# Patient Record
Sex: Male | Born: 2000 | Race: White | Hispanic: No | Marital: Single | Smoking: Never smoker
Health system: Southern US, Community
[De-identification: ages and names within clinical notes are randomized; demographics above are authoritative.]

## PROBLEM LIST (undated history)

## (undated) HISTORY — PX: ADENOIDECTOMY: SHX5191

---

## 2020-03-05 ENCOUNTER — Ambulatory Visit: Payer: Self-pay

## 2020-11-13 ENCOUNTER — Encounter (HOSPITAL_COMMUNITY): Payer: Self-pay | Admitting: Emergency Medicine

## 2020-11-13 ENCOUNTER — Emergency Department (HOSPITAL_COMMUNITY)
Admission: EM | Admit: 2020-11-13 | Discharge: 2020-11-14 | Disposition: A | Discharge status: Home / Self Care | Payer: Worker's Compensation | Attending: Emergency Medicine | Admitting: Emergency Medicine

## 2020-11-13 ENCOUNTER — Other Ambulatory Visit: Payer: Self-pay

## 2020-11-13 ENCOUNTER — Emergency Department (HOSPITAL_COMMUNITY): Payer: Worker's Compensation

## 2020-11-13 DIAGNOSIS — S02600A Fracture of unspecified part of body of mandible, initial encounter for closed fracture: Secondary | ICD-10-CM | POA: Insufficient documentation

## 2020-11-13 DIAGNOSIS — W208XXA Other cause of strike by thrown, projected or falling object, initial encounter: Secondary | ICD-10-CM | POA: Insufficient documentation

## 2020-11-13 DIAGNOSIS — S01511A Laceration without foreign body of lip, initial encounter: Secondary | ICD-10-CM | POA: Insufficient documentation

## 2020-11-13 DIAGNOSIS — S02609A Fracture of mandible, unspecified, initial encounter for closed fracture: Secondary | ICD-10-CM

## 2020-11-13 DIAGNOSIS — S0993XA Unspecified injury of face, initial encounter: Secondary | ICD-10-CM | POA: Diagnosis present

## 2020-11-13 DIAGNOSIS — Y990 Civilian activity done for income or pay: Secondary | ICD-10-CM

## 2020-11-13 MED ORDER — LIDOCAINE HCL (PF) 1 % IJ SOLN
30.0000 mL | Freq: Once | INTRAMUSCULAR | Status: AC
  Administered 2020-11-14: 30 mL
  Filled 2020-11-13: qty 30

## 2020-11-13 NOTE — ED Provider Notes (Addendum)
[facility] EMERGENCY DEPARTMENT Provider Note   CSN: [id number] Arrival date & time: 11/13/20  1929     History Chief Complaint  Patient presents with  . Lip Laceration    Jeffrey Gibbs is a 19 y.o. male presents to ER for evaluation of work related injury.  He was on the back of a work truck when a piece of plywood flew up and hit him directly on the mouth. Patient has large upper lip laceration with some abrasion of the nose.  Reports local lip burning pain, constant burning.  Denies LOC, visual changes, nausea, vomiting, neck pain or any other injuries.  No OAC. No interventions. Last tetanus in the last 2 years.   HPI     History reviewed. No pertinent past medical history.  There are no problems to display for this patient.   Past Surgical History:  Procedure Laterality Date  . ADENOIDECTOMY         No family history on file.  Social History   Tobacco Use  . Smoking status: Never Smoker  . Smokeless tobacco: Never Used  Substance Use Topics  . Alcohol use: Never  . Drug use: Never    Home Medications Prior to Admission medications   Medication Sig Start Date End Date Taking? Authorizing Provider  clindamycin (CLEOCIN) 150 MG capsule Take 3 capsules (450 mg total) by mouth 3 (three) times daily for 10 days. You may open capsules and mix into soft/pureed foods to swallow 11/14/20 11/24/20  Liberty Handy, PA-C  oxyCODONE (OXY IR/ROXICODONE) 5 MG immediate release tablet Take 1 tablet (5 mg total) by mouth every 6 (six) hours as needed for up to 3 days for severe pain. 11/14/20 11/17/20  Liberty Handy, PA-C    Allergies    Patient has no allergy information on record.  Review of Systems   Review of Systems  Skin: Positive for wound.  All other systems reviewed and are negative.   Physical Exam Updated Vital Signs BP 124/80   Pulse 84   Temp 97.6 F (36.4 C) (Axillary)   Resp 15   Ht 6' (1.829 m)   Wt 104.3 kg   SpO2  98%   BMI 31.19 kg/m   Physical Exam Vitals and nursing note reviewed.  Constitutional:      General: He is not in acute distress.    Appearance: He is well-developed.     Comments: NAD.  HENT:     Head: Normocephalic.     Comments: Small abrasion over nose bridge, non tender.  TTP upper lip and chin.  No obvious crepitus. No scalp tenderness.     Right Ear: External ear normal.     Left Ear: External ear normal.     Nose: Nose normal.     Comments: No nasal bone tenderness. Normal septum without hematoma.     Mouth/Throat:     Comments: Flap like laceration across upper vermillion border approximately 6 cm, jagged. Not through and through. Upper dentition without tenderness to percussion. Upper gingiva and mucosa unremarkable.   Curved gaping laceration at the base of lower gingiva across frenulum, no obvious bone exposed. TTP and percussion of right lateral incisor and canine, these teeth are stable without obvious fracture.  Eyes:     General: No scleral icterus.    Conjunctiva/sclera: Conjunctivae normal.     Comments: No periorbital tenderness. Normal eyelids   Cardiovascular:     Rate and Rhythm: Normal rate and regular rhythm.  Heart sounds: Normal heart sounds. No murmur heard.   Pulmonary:     Effort: Pulmonary effort is normal.     Breath sounds: Normal breath sounds. No wheezing.  Musculoskeletal:        General: No deformity. Normal range of motion.     Cervical back: Normal range of motion and neck supple.  Skin:    General: Skin is warm and dry.     Capillary Refill: Capillary refill takes less than 2 seconds.  Neurological:     Mental Status: He is alert and oriented to person, place, and time.  Psychiatric:        Behavior: Behavior normal.        Thought Content: Thought content normal.        Judgment: Judgment normal.          ED Results / Procedures / Treatments   Labs (all labs ordered are listed, but only abnormal results are  displayed) Labs Reviewed - No data to display  EKG None  Radiology CT Maxillofacial Wo Contrast  Result Date: 11/13/2020 CLINICAL DATA:  Hit in the chin EXAM: CT MAXILLOFACIAL WITHOUT CONTRAST TECHNIQUE: Multidetector CT imaging of the maxillofacial structures was performed. Multiplanar CT image reconstructions were also generated. COMPARISON:  None. FINDINGS: Osseous: There is a nondisplaced incomplete fracture seen through the right inferior mandible between the lateral incisor and canine. Small foci of subcutaneous emphysema seen overlying this region. No other fractures are identified. Orbits: No fracture identified. Unremarkable appearance of globes and orbits. Sinuses: The visualized paranasal sinuses and mastoid air cells are unremarkable. Soft tissues: Soft tissue swelling with a small hematoma and subcutaneous emphysema seen overlying the lower mandible. Limited intracranial: No acute findings. IMPRESSION: Nondisplaced incomplete fracture seen through the right inferior mandible between the lateral incisor and canine. There is small soft tissue hematoma with swelling and subcutaneous emphysema overlying the lower mandible. Electronically Signed   By: Jonna ClarkBindu  Avutu M.D.   On: 11/13/2020 21:29    Procedures .Marland Kitchen.Laceration Repair  Date/Time: 11/14/2020 1:04 AM Performed by: Liberty HandyGibbons, Leveon Pelzer J, PA-C Authorized by: Liberty HandyGibbons, Loui Massenburg J, PA-C   Consent:    Consent obtained:  Verbal   Consent given by:  Patient   Risks discussed:  Infection, pain, poor cosmetic result, poor wound healing, nerve damage and need for additional repair   Alternatives discussed:  Referral Anesthesia (see MAR for exact dosages):    Anesthesia method:  Local infiltration   Local anesthetic:  Lidocaine 1% w/o epi Laceration details:    Location:  Mouth   Mouth location: lower gingiva.   Length (cm):  6 Repair type:    Repair type:  Complex Pre-procedure details:    Preparation:  Imaging obtained to evaluate  for foreign bodies Exploration:    Limited defect created (wound extended): no     Hemostasis achieved with:  Direct pressure   Wound exploration: wound explored through full range of motion and entire depth of wound probed and visualized     Wound extent: underlying fracture and vascular damage     Contaminated: no   Treatment:    Wound cleansed with: peroxide, iodine, NS 500 cc irrigation.   Irrigation method:  Pressure wash and tap   Visualized foreign bodies/material removed: no     Debridement:  None   Undermining:  None Mucous membrane repair:    Suture size:  6-0   Suture material:  Vicryl   Suture technique:  Simple interrupted   Number of sutures:  4 Approximation:    Approximation:  Loose Post-procedure details:    Patient tolerance of procedure:  Tolerated well, no immediate complications .Marland KitchenLaceration Repair  Date/Time: 11/14/2020 1:06 AM Performed by: Liberty Handy, PA-C Authorized by: Liberty Handy, PA-C   Consent:    Consent obtained:  Verbal   Consent given by:  Patient   Risks discussed:  Infection, need for additional repair, pain, poor cosmetic result, poor wound healing, vascular damage and nerve damage   Alternatives discussed:  Referral Anesthesia (see MAR for exact dosages):    Anesthesia method:  Local infiltration   Local anesthetic:  Lidocaine 1% w/o epi Laceration details:    Location:  Lip   Lip location:  Upper exterior lip   Length (cm):  6 Repair type:    Repair type:  Complex Exploration:    Hemostasis achieved with:  Direct pressure   Wound exploration: wound explored through full range of motion and entire depth of wound probed and visualized     Wound extent: vascular damage     Wound extent: no underlying fracture noted   Treatment:    Area cleansed with:  Betadine (peroxide, iodine, NS irrigation solution 500 cc)   Irrigation method:  Pressure wash and tap   Debridement:  None   Undermining:  None   Scar revision: no    Skin repair:    Repair method:  Sutures   Suture size:  6-0   Wound skin closure material used: vicryl rapide.   Suture technique:  Simple interrupted   Number of sutures:  4 Approximation:    Approximation:  Close   Vermilion border: well-aligned (with difficulty given edema)   Post-procedure details:    Dressing:  Open (no dressing)   Patient tolerance of procedure:  Tolerated well, no immediate complications   (including critical care time)  Medications Ordered in ED Medications  lidocaine (PF) (XYLOCAINE) 1 % injection 30 mL (30 mLs Infiltration Given 11/14/20 0017)  oxyCODONE-acetaminophen (PERCOCET/ROXICET) 5-325 MG per tablet 1 tablet (1 tablet Oral Given 11/14/20 0016)  clindamycin (CLEOCIN) capsule 450 mg (450 mg Oral Given 11/14/20 0016)    ED Course  I have reviewed the triage vital signs and the nursing notes.  Pertinent labs & imaging results that were available during my care of the patient were reviewed by me and considered in my medical decision making (see chart for details).  Clinical Course as of Nov 14 113  Wynelle Link Nov 13, 2020  2138 IMPRESSION: Nondisplaced incomplete fracture seen through the right inferior mandible between the lateral incisor and canine.  There is small soft tissue hematoma with swelling and subcutaneous emphysema overlying the lower mandible.    CT Maxillofacial Wo Contrast [CG]    Clinical Course User Index [CG] Jerrell Mylar   MDM Rules/Calculators/A&P                          19 yo M here with facial trauma.  Complex laceration of upper external lip involving vermillion border and lower inner lip involving the frenulum.   Exam reassuring without physical findings to suggest other injuries.   Imaging ordered CT maxillofacial. This was personally visualized and interpreted and reveals incomplete non displaced right inferior mandible fracture.    Patient's tdap has been within last 2 years.  Discussed case with Dr  Arita Miss (plastic surgery/ENT trauma) who suggested laceration repair in ER by ED providers with outpatient follow up in clinic.  He provided specific guidance on laceration repair including vicryl rapide, loose approximation of lower inner lip laceration and minimal suturing to approximate upper external lip laceration across vermillion boarder.  Dr Arita Miss did not recommend open fracture treatment, admission or antibiotics. Discussed with patient risk of repair including need for revision, infection, vascular and nerve damage.  Risks and benefits of repair in ER discussed and he verbalized understanding and agreed to proceed. Laceration was complex as anticipated. Thoroughly irrigated. No immediate complications post procedure.   Will dc with clindamycin, soft diet, NSAIDs, oxycodone and follow up with Dr Arita Miss in 1 week. Return precautions given. Patient in agreement with ER POC.  Final Clinical Impression(s) / ED Diagnoses Final diagnoses:  Closed fracture of right side of mandible, unspecified mandibular site, initial encounter (HCC)  Lip laceration, initial encounter  Work related injury    Rx / DC Orders ED Discharge Orders         Ordered    clindamycin (CLEOCIN) 150 MG capsule  3 times daily        11/14/20 0041    oxyCODONE (OXY IR/ROXICODONE) 5 MG immediate release tablet  Every 6 hours PRN        11/14/20 0041             Liberty Handy, PA-C 11/14/20 0115    Alvira Monday, MD 11/14/20 [phone]

## 2020-11-13 NOTE — ED Triage Notes (Signed)
Pt to ED via EMS st's he was on the back of a work truck and when it turned a piece of plywood flew up and hit him in the mouth.  Pt has large lac to upper lip.  Denies LOC, teeth appear intact

## 2020-11-13 NOTE — ED Notes (Signed)
Patient transported to CT 

## 2020-11-14 ENCOUNTER — Other Ambulatory Visit: Payer: Self-pay

## 2020-11-14 MED ORDER — CLINDAMYCIN HCL 150 MG PO CAPS: 450.0000 mg | ORAL_CAPSULE | Freq: Three times a day (TID) | ORAL | 0 refills | Status: AC

## 2020-11-14 MED ORDER — CLINDAMYCIN HCL 150 MG PO CAPS
450.0000 mg | ORAL_CAPSULE | Freq: Once | ORAL | Status: AC
  Administered 2020-11-14: 450 mg via ORAL
  Filled 2020-11-14: qty 3

## 2020-11-14 MED ORDER — OXYCODONE HCL 5 MG PO TABS
5.0000 mg | ORAL_TABLET | Freq: Four times a day (QID) | ORAL | 0 refills | Status: AC | PRN
Start: 2020-11-14 — End: 2020-11-17

## 2020-11-14 MED ORDER — OXYCODONE-ACETAMINOPHEN 5-325 MG PO TABS
1.0000 | ORAL_TABLET | Freq: Once | ORAL | Status: AC
  Administered 2020-11-14: 1 via ORAL
  Filled 2020-11-14: qty 1

## 2020-11-14 NOTE — Discharge Instructions (Signed)
You were seen in the ER for facial injuries  CT shows incomplete/partial small fracture of your mandible (at level of lateral incision/canine).    You had complex lacerations of your upper/lower internal and external lips  You have absorbable sutures  For pain and inflammation you can use a combination of ibuprofen and acetaminophen.  Take [phone] mg acetaminophen (tylenol) every 6 hours or 600 mg ibuprofen (advil, motrin) every 6 hours.  You can take these separately or combine them every 6 hours for maximum pain control. Do not exceed 4,000 mg acetaminophen or 2,400 mg ibuprofen in a 24 hour period.  Do not take ibuprofen containing products if you have history of kidney disease, ulcers, GI bleeding, severe acid reflux, or take a blood thinner.  Do not take acetaminophen if you have liver disease.   For break through and/or severe pain despite ibuprofen and acetaminophen regimen, take 5 mg oxycodone every 6 hours.  Oxycodone is a narcotic pain medication that has risk of overdose, death, dependence and abuse. Mild and expected side effects include nausea, stomach upset, drowsiness, constipation. Do not consume alcohol, drive or use heavy machinery while taking this medication. Do not leave unattended around children. Flush any remaining pills that you do not use and do not share.  The emergency department has a strict policy regarding prescription of narcotic medications. We prescribe a short course for acute, new pain or injuries. We are unable to refill this medication in the emergency department for chronic pain or repeatedly.  Refill need to be done by specialist or primary care provider or pain clinic.  Contact your primary care provider or specialist for chronic pain management and refill on narcotic medications.   Soft diet only. Sleep with head slightly elevated. Gentle indirect ice (wrapped in towel) to lips can help with swelling and bruising, ice for no more than 5-10 min at a  time  Clindamycin 450 mg every 8 hours to prevent infection  Return for signs of infected wounds like more pain, swelling, warmth, pus, fevers  Call plastic surgeon Dr Arita Miss tomorrow and make an appointment for re-evaluation this week

## 2020-11-24 ENCOUNTER — Other Ambulatory Visit: Payer: Self-pay

## 2020-11-24 ENCOUNTER — Ambulatory Visit (INDEPENDENT_AMBULATORY_CARE_PROVIDER_SITE_OTHER): Payer: Worker's Compensation | Admitting: Plastic Surgery

## 2020-11-24 ENCOUNTER — Encounter: Payer: Self-pay | Admitting: Plastic Surgery

## 2020-11-24 VITALS — BP 114/71 | HR 72 | Temp 98.5°F

## 2020-11-24 DIAGNOSIS — S01511A Laceration without foreign body of lip, initial encounter: Secondary | ICD-10-CM | POA: Diagnosis not present

## 2020-11-24 NOTE — Progress Notes (Signed)
Referring Provider No referring provider defined for this encounter.   CC:  Chief Complaint  Patient presents with  . Follow-up      Jeffrey Gibbs is an 19 y.o. male.  HPI: Patient presents as a referral from the emergency room for facial trauma.  He was traveling with some boards that he had used for setting up a theater set and one of the boards shifted along return and hit him in the face.  He sustained an upper lip laceration and a gingival laceration inside the lower lip.  There was a concern for mandibular fracture on his CT scanning but this did not look to be present by my read.  He reports minimal symptoms related to his teeth and only complains of some discomfort in his lower lip at this point.  He was sutured up in the emergency room.  No Known Allergies  Outpatient Encounter Medications as of 11/24/2020  Medication Sig  . clindamycin (CLEOCIN) 150 MG capsule Take 3 capsules (450 mg total) by mouth 3 (three) times daily for 10 days. You may open capsules and mix into soft/pureed foods to swallow   No facility-administered encounter medications on file as of 11/24/2020.     No past medical history on file.  Past Surgical History:  Procedure Laterality Date  . ADENOIDECTOMY      No family history on file.  Social History   Social History Narrative  . Not on file     Review of Systems General: Denies fevers, chills, weight loss CV: Denies chest pain, shortness of breath, palpitations  Physical Exam Vitals with BMI 11/24/2020 11/14/2020 11/14/2020  Height - - -  Weight - - -  BMI - - -  Systolic 114 129 366  Diastolic 71 89 80  Pulse 72 95 84    General:  No acute distress,  Alert and oriented, Non-Toxic, Normal speech and affect On examination he has a fairly well-healed upper lip incision.  Looks to be nice alignment of the vermilion border.  The piece did appear to be partially amputated right at the central upper lip unit.  This extended onto the skin  portion of the upper lip as well.  That partially amputated piece is a bit swollen taking on a bit more prominent shape compared to the adjacent lateral lip elements.  The intraoral laceration has healed nicely.  All of his teeth appear well aligned and he reports normal occlusion.  Otherwise cranial nerves are grossly intact and sensation seems to be intact in the V1 V2 and V3 distributions.  Extraocular movements intact.  Pupils equal round reactive to light.  Assessment/Plan Patient presents after facial trauma.  He has nice early result after the emergency room sutured his upper lip.  We will have to watch down the line how this does and if he does require a revision I think it should be fairly limited.  I advised him after another week or so to start massaging this area I have given him recommendations for scar creams to to be focused on the skin portion of the upper lip.  I have advised him that silicone scar creams are the most effective and given them a couple options to pick from.  Given that he is having no symptoms and very minimal pain related to his jaw I think it safe to assume that it was a artifact seen on CT scan and there is no significant fracture that should require treatment or alter his behavior.  I am planning to see him again in 6 months to check on this and discuss its progress.  If anything happens between now and then more there are other concerns I could see him sooner.  I have also reminded him to avoid sun exposure on the upper lip scar.  Allena Napoleon 11/24/2020, 2:33 PM

## 2021-01-17 ENCOUNTER — Telehealth: Payer: Self-pay

## 2021-01-17 NOTE — Telephone Encounter (Signed)
Called and LMOM @ 4:00pm asking the patient's mother to RTC regarding the message below.//AB/CMA

## 2021-01-17 NOTE — Telephone Encounter (Signed)
Patient's mom Lavoy Bernards) called to say that since her son last saw Dr. Arita Miss, his lip has gotten bigger in one area (puffier).  Darlene said that originally the patient's whole bottom lip was more swollen and now the lip looks completely different.  She would like to know if he needs to see Dr. Arita Miss again or should she just send some pictures first.  Please call.

## 2021-01-18 ENCOUNTER — Telehealth: Payer: Self-pay | Admitting: Plastic Surgery

## 2021-01-18 ENCOUNTER — Encounter: Payer: Self-pay | Admitting: Plastic Surgery

## 2021-01-18 NOTE — Telephone Encounter (Signed)
Patient's mother called back regarding the message below.  Informed her that I spoke with [facility] and he recommend the patient use Ice,Ibuprofen, and schedule an appointment with Dr. Arita Miss.  The patient's mother verbalized understanding and agreed.  The patient's mother asked about sending picture's for Dr. Arita Miss to see.   Informed her that Dr. Arita Miss is out of the office this week, but he will be back next week.  She stated that she will go ahead and sent the picture's.   She asked how she could sent the picture's.  I informed her if the patient has MyChart she can send the picture's that way.  She verbalized understanding and agreed.  The patient's mother will call back to schedule an appointment for the patient.//AB/CMA

## 2021-01-18 NOTE — Telephone Encounter (Signed)
Patient's mom, Agustin Cree, returned call from [place]. Would like a return call.

## 2021-01-19 NOTE — Telephone Encounter (Signed)
See note on 01/17/21//AB/CMA

## 2021-02-09 ENCOUNTER — Ambulatory Visit: Payer: PRIVATE HEALTH INSURANCE | Admitting: Surgical

## 2021-02-15 ENCOUNTER — Other Ambulatory Visit: Payer: Self-pay

## 2021-02-15 ENCOUNTER — Ambulatory Visit (INDEPENDENT_AMBULATORY_CARE_PROVIDER_SITE_OTHER): Payer: Worker's Compensation | Admitting: Surgical

## 2021-02-15 ENCOUNTER — Encounter: Payer: Self-pay | Admitting: Surgical

## 2021-02-15 VITALS — BP 115/75 | HR 64

## 2021-02-15 DIAGNOSIS — S01511D Laceration without foreign body of lip, subsequent encounter: Secondary | ICD-10-CM

## 2021-02-15 NOTE — Progress Notes (Signed)
   Referring Provider No referring provider defined for this encounter.   CC:  Chief Complaint  Patient presents with  . Follow-up      Jeffrey Gibbs is an 20 y.o. male.  HPI: Patient is a 20 year old male here for follow-up on his upper lip laceration that occurred while working. Patient had this repaired in the ED on 01/14/2020 and then subsequently followed up in our office on 11/24/2020 for evaluation of the repair/further management.  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  Patient sent a MyChart message on 01/18/2021 reporting that he noticed some changes in his upper lip that concerned him.  He was instructed to come in for evaluation.  Patient reports that he has noticed that the swelling has improved since the photo he sent to [place] for review.  He reports that he has noticed some thickened tissue in his lip that is bothersome to him. He reports that he has an appointment scheduled with Dr. Arita Miss in June to reevaluate his lip.  Review of Systems Skin: No wounds  Physical Exam Vitals with BMI 11/24/2020 11/14/2020 11/14/2020  Height - - -  Weight - - -  BMI - - -  Systolic 114 129 272  Diastolic 71 89 80  Pulse 72 95 84    General:  No acute distress,  Alert and oriented, Non-Toxic, Normal speech and affect Lip: Lip incision is well-healed. Vermilion border is fairly well aligned.  He does have a thickened scar ball within the lip, it is mildly tender to palpation.  There is no overlying skin changes.  There is no erythema noted.  Mouth: Intraoral laceration is well-healed, has minimal tethering of the oral mucosa to the gingiva.   Assessment/Plan  Recommend continued use of scar cream, recommend using sunscreen when exposed to the sun to prevent discoloration.  Recommend massaging the scar ball within his lip 1-2 times daily to help soften this up.  I discussed with the patient that this will take some time for it to resolve.  I recommend he continue with the massaging over  the next 4 months and follow-up with [place] at that time for reevaluation.  I discussed with the patient that if he has any changes or questions or concerns we would be happy to see him sooner.  I discussed with the patient that it can take time for the scarring to soften up.  I discussed with the patient that in 4 months at our next follow-up we would discuss any surgical interventions that may be necessary if symptoms do not resolve or improve.  Patient is in agreement with this.  All of his questions were answered to his content.  Kermit Balo Scheeler 02/15/2021, 8:41 AM

## 2021-05-25 ENCOUNTER — Ambulatory Visit (INDEPENDENT_AMBULATORY_CARE_PROVIDER_SITE_OTHER): Payer: Worker's Compensation | Admitting: Plastic Surgery

## 2021-05-25 ENCOUNTER — Other Ambulatory Visit: Payer: Self-pay

## 2021-05-25 DIAGNOSIS — S01511D Laceration without foreign body of lip, subsequent encounter: Secondary | ICD-10-CM | POA: Diagnosis not present

## 2021-05-25 NOTE — Progress Notes (Signed)
   Referring Provider No referring provider defined for this encounter.   CC:  Chief Complaint  Patient presents with  . Follow-up      Jeffrey Gibbs is an 20 y.o. male.  HPI: Patient presents with his mom for follow-up regarding an upper lip scar.  This occurred while working and essentially avulsed a central portion of his upper lip from superior to inferior leaving some inferior soft tissue attachments.  This was sutured in the emergency room.  He has had a step-off along the vermilion border on the right side along with some indentations in the lip in that location.  We have given this about 6 months to soften.  He still believes he will be interested in some revision work.  Review of Systems General: Denies fevers and chills  Physical Exam Vitals with BMI 02/15/2021 11/24/2020 11/14/2020  Height - - -  Weight - - -  BMI - - -  Systolic 115 114 950  Diastolic 75 71 89  Pulse 64 72 95    General:  No acute distress,  Alert and oriented, Non-Toxic, Normal speech and affect Examination shows overall significant improvement in the contour since last time I saw him.  The left or more central aspect of the scar looks to have healed well with good approximation of the vermilion border and flattening of the surface.  The right aspect which is more lateral has a bit more of a depression along the scar and there is a 1 to 2 mm step-off of the vermilion border.  There is some mild scar depressions going along superiorly on the skin portion of his lip.  Assessment/Plan Patient presents with persistent deformity of an upper lip scar after trauma.  I do think he would benefit from a revision.  We discussed doing an excision of the dented portion that caused the lip depression and step-off along with realignment of the vermilion border in that location.  Additionally we can do some dermabrasion along the skin portion of the lip which I think will flatten that.  We discussed the expected recovery  after this and I do think he would derive benefit.  We discussed the risk that include bleeding, infection, damage to surrounding structures and need for additional procedures.  All of his and his mother's questions were answered and we will plan to move forward.  Allena Napoleon 05/25/2021, 4:45 PM

## 2021-08-17 ENCOUNTER — Ambulatory Visit: Payer: 59 | Admitting: Plastic Surgery

## 2021-08-31 ENCOUNTER — Ambulatory Visit: Payer: 59 | Admitting: Plastic Surgery

## 2021-09-10 ENCOUNTER — Other Ambulatory Visit: Payer: Self-pay

## 2021-09-10 ENCOUNTER — Encounter (HOSPITAL_COMMUNITY): Payer: Self-pay

## 2021-09-10 ENCOUNTER — Ambulatory Visit (HOSPITAL_COMMUNITY)
Admission: EM | Admit: 2021-09-10 | Discharge: 2021-09-10 | Disposition: A | Discharge status: Home / Self Care | Payer: 59 | Attending: Student | Admitting: Student

## 2021-09-10 DIAGNOSIS — S61214A Laceration without foreign body of right ring finger without damage to nail, initial encounter: Secondary | ICD-10-CM | POA: Diagnosis not present

## 2021-09-10 NOTE — ED Provider Notes (Signed)
[facility]    CSN: [id number] Arrival date & time: 09/10/21  1424      History   Chief Complaint Chief Complaint  Patient presents with   Laceration    HPI Ryelan Kazee is a 20 y.o. male presenting with laceration to right ring finger that occurred about 3 hours ago.  He states a jar shattered and cut the finger.  It has stopped bleeding.  Last Tdap was about 3 years ago.  Denies sensation changes, pain or injury elsewhere.  States he came in because he was concerned that it is infected.  HPI  History reviewed. No pertinent past medical history.  There are no problems to display for this patient.   Past Surgical History:  Procedure Laterality Date   ADENOIDECTOMY         Home Medications    Prior to Admission medications   Not on File    Family History Family History  Family history unknown: Yes    Social History Social History   Tobacco Use   Smoking status: Never   Smokeless tobacco: Never  Substance Use Topics   Alcohol use: Never   Drug use: Never     Allergies   Patient has no known allergies.   Review of Systems Review of Systems  Skin:  Positive for wound.  All other systems reviewed and are negative.   Physical Exam Triage Vital Signs ED Triage Vitals  Enc Vitals Group     BP 09/10/21 1505 122/84     Pulse Rate 09/10/21 1505 64     Resp 09/10/21 1505 18     Temp 09/10/21 1505 98.4 F (36.9 C)     Temp Source 09/10/21 1505 Oral     SpO2 09/10/21 1505 98 %     Weight --      Height --      Head Circumference --      Peak Flow --      Pain Score 09/10/21 1508 4     Pain Loc --      Pain Edu? --      Excl. in GC? --    No data found.  Updated Vital Signs BP 122/84 (BP Location: Left Arm)   Pulse 64   Temp 98.4 F (36.9 C) (Oral)   Resp 18   SpO2 98%   Visual Acuity Right Eye Distance:   Left Eye Distance:   Bilateral Distance:    Right Eye Near:   Left Eye Near:    Bilateral Near:     Physical  Exam Vitals reviewed.  Constitutional:      General: He is not in acute distress.    Appearance: Normal appearance. He is not ill-appearing or diaphoretic.  HENT:     Head: Normocephalic and atraumatic.  Cardiovascular:     Rate and Rhythm: Normal rate and regular rhythm.     Heart sounds: Normal heart sounds.  Pulmonary:     Effort: Pulmonary effort is normal.     Breath sounds: Normal breath sounds.  Skin:    General: Skin is warm.     Findings: Laceration present.     Comments: See image below R ring finger with 1cm laceration, not actively bleeding. Surrounding sensation intact. Cap refill < 2 seconds. ROM finger intact and without pain.   Neurological:     General: No focal deficit present.     Mental Status: He is alert and oriented to person, place, and time.  Psychiatric:        Mood and Affect: Mood normal.        Behavior: Behavior normal.        Thought Content: Thought content normal.        Judgment: Judgment normal.       UC Treatments / Results  Labs (all labs ordered are listed, but only abnormal results are displayed) Labs Reviewed - No data to display  EKG   Radiology No results found.  Procedures Laceration Repair  Date/Time: 09/10/2021 3:19 PM Performed by: Rhys Martini, PA-C Authorized by: Rhys Martini, PA-C   Consent:    Consent obtained:  Verbal   Consent given by:  Patient   Risks, benefits, and alternatives were discussed: yes     Risks discussed:  Infection, need for additional repair, poor cosmetic result and pain   Alternatives discussed:  No treatment Universal protocol:    Procedure explained and questions answered to patient or proxy's satisfaction: yes     Patient identity confirmed:  Verbally with patient Anesthesia:    Anesthesia method:  None Laceration details:    Location:  Finger   Finger location:  R ring finger   Length (cm):  1 Treatment:    Area cleansed with:  Povidone-iodine and saline   Amount of  cleaning:  Standard   Irrigation solution:  Sterile saline Skin repair:    Repair method:  Tissue adhesive Approximation:    Approximation:  Close Post-procedure details:    Dressing:  Open (no dressing)   Procedure completion:  Tolerated well, no immediate complications (including critical care time)  Medications Ordered in UC Medications - No data to display  Initial Impression / Assessment and Plan / UC Course  I have reviewed the triage vital signs and the nursing notes.  Pertinent labs & imaging results that were available during my care of the patient were reviewed by me and considered in my medical decision making (see chart for details).     This patient is a very pleasant 20 y.o. year old male presenting with laceration- R ring finger. Neurovascularly intact. Dermabond applied as above. Tdap UTD approx 2019 per pt. Wound care provided and discussed. ED return precautions discussed. Patient verbalizes understanding and agreement.    Final Clinical Impressions(s) / UC Diagnoses   Final diagnoses:  Laceration of right ring finger without foreign body without damage to nail, initial encounter     Discharge Instructions      -I applied Dermabond glue today. This will help your laceration heal well. It's waterproof, so you can still wash your hands with gentle soap and water, and shower. Avoid hydrogen peroxide or alcohol to cleanse. You can cover with a bandaid if you want to, but you don't have to. Dermabond comes off on its own in about 3-4 days.  -Come back and see [place] if the wound is getting worse: new redness, swelling, pain, discharge, etc.  -Tylenol/ibuprofen for pain      ED Prescriptions   None    PDMP not reviewed this encounter.   Rhys Martini, PA-C 09/10/21 1559

## 2021-09-10 NOTE — Discharge Instructions (Addendum)
-  I applied Dermabond glue today. This will help your laceration heal well. It's waterproof, so you can still wash your hands with gentle soap and water, and shower. Avoid hydrogen peroxide or alcohol to cleanse. You can cover with a bandaid if you want to, but you don't have to. Dermabond comes off on its own in about 3-4 days.  -Come back and see [place] if the wound is getting worse: new redness, swelling, pain, discharge, etc.  -Tylenol/ibuprofen for pain

## 2021-09-10 NOTE — ED Triage Notes (Signed)
Pt presents with cut t o right ring finger from piece of glass today.

## 2021-09-27 ENCOUNTER — Ambulatory Visit (INDEPENDENT_AMBULATORY_CARE_PROVIDER_SITE_OTHER): Payer: Worker's Compensation | Admitting: Plastic Surgery

## 2021-09-27 ENCOUNTER — Encounter: Payer: Self-pay | Admitting: Plastic Surgery

## 2021-09-27 ENCOUNTER — Other Ambulatory Visit: Payer: Self-pay

## 2021-09-27 VITALS — BP 121/81 | HR 66

## 2021-09-27 DIAGNOSIS — S01511D Laceration without foreign body of lip, subsequent encounter: Secondary | ICD-10-CM | POA: Diagnosis not present

## 2021-09-27 DIAGNOSIS — Z719 Counseling, unspecified: Secondary | ICD-10-CM

## 2021-09-27 NOTE — Progress Notes (Signed)
Operative Note   DATE OF OPERATION: 09/27/2021  LOCATION:    SURGICAL DEPARTMENT: Plastic Surgery  PREOPERATIVE DIAGNOSES: Upper lip scar  POSTOPERATIVE DIAGNOSES:  same  PROCEDURE:  Excision of upper lip scar measuring 2 cm Complex closure measuring 2 cm Dermabrasion upper lip  SURGEON: Ancil Linsey, MD  ANESTHESIA:  Local  COMPLICATIONS: None.   INDICATIONS FOR PROCEDURE:  The patient, Jeffrey Gibbs is a 20 y.o. male born on [birth year], is here for treatment of upper lip scar with a step-off along the vermilion border MRN: [id number]  CONSENT:  Informed consent was obtained directly from the patient. Risks, benefits and alternatives were fully discussed. Specific risks including but not limited to bleeding, infection, hematoma, seroma, scarring, pain, infection, wound healing problems, and need for further surgery were all discussed. The patient did have an ample opportunity to have questions answered to satisfaction.   DESCRIPTION OF PROCEDURE:  I started by marking out the vermilion border of his upper lip.  On the right side there was about a 2 mm step-off in line with his scar.  There is also some depression along the dry vermilion extending in for inferiorly that was noticeable.  I marked out my planned excision as well.  Local anesthesia was administered. The patient's operative site was prepped and draped in a sterile fashion. A time out was performed and all information was confirmed to be correct.  I started by performing dermabrasion along the skin portion of the upper lip.  There was a slight raised nature to the scar in that area which I smoothed out with a dermabrasion.  This was down to very faint punctate bleeding of the dermis.  The vermilion portion of the scar was excised with a 15 blade.  Hemostasis was obtained.  Circumferential undermining was performed and the skin was advanced and closed in layers with interrupted buried Monocryl sutures and 5-0 fast gut  for the skin.  I took care to preferentially line up the vermilion border so that this would correct the step-off.  I also performed mattress sutures in the vermilion portion of the closure to try to evert the edges.  The underlying muscle was also repaired to try to avoid recurrence of the depression.  The lesion excised measured 2 cm, and the total length of closure measured 2 cm.    The patient tolerated the procedure well.  There were no complications.

## 2021-10-11 ENCOUNTER — Ambulatory Visit: Payer: 59 | Admitting: Plastic Surgery

## 2021-10-18 ENCOUNTER — Ambulatory Visit (INDEPENDENT_AMBULATORY_CARE_PROVIDER_SITE_OTHER): Payer: Worker's Compensation | Admitting: Plastic Surgery

## 2021-10-18 ENCOUNTER — Other Ambulatory Visit: Payer: Self-pay

## 2021-10-18 DIAGNOSIS — S01511D Laceration without foreign body of lip, subsequent encounter: Secondary | ICD-10-CM | POA: Diagnosis not present

## 2021-10-18 NOTE — Progress Notes (Signed)
Patient presents about 2 weeks postop from scar revision of his upper lip.  I did an excision and correction of the vermilion border step-off and dermabrasion to the skin of the upper lip.  He is overall happy.  On exam this looks to be healing nicely.  Vermilion border is well corrected.  There was a couple scabs on the vermilion itself that I removed and this looks to be doing fine.  There may be some subtle swelling just central of the vermilion scar in the lip tubercle but hopefully this will soften as time goes on.  We will plan to do the silicone scar cream over the skin portion of the lip and he can stop doing Vaseline.  I will see him again in around 8 weeks time to check his progress.  All of his questions were answered.

## 2021-10-19 ENCOUNTER — Ambulatory Visit: Payer: 59 | Admitting: Plastic Surgery

## 2021-11-18 IMAGING — CT CT MAXILLOFACIAL W/O CM
3 of 6 series · 16 of 47 positions shown, 19 images · non-contrast
Comparison: None.

CLINICAL DATA: Hit in the chin

EXAM:
CT MAXILLOFACIAL WITHOUT CONTRAST
TECHNIQUE: Multidetector CT imaging of the maxillofacial structures was
performed. Multiplanar CT image reconstructions were also generated.

[Series 3: maxilllofacial 2.0 hr40 3 · axial · 0.36mm/px · z∈[+98,+258]mm · 11 of 94 slices shown, 14 images]
[im 7/94  brain]
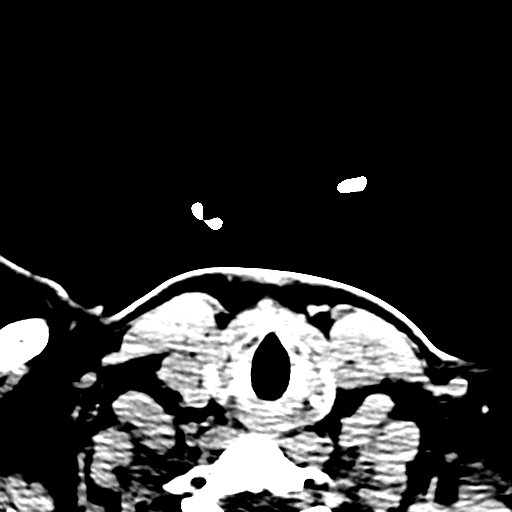
[im 7/94  bone]
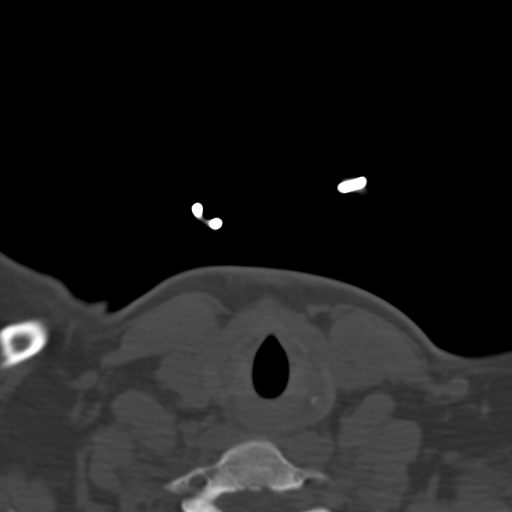
[im 14/94  bone]
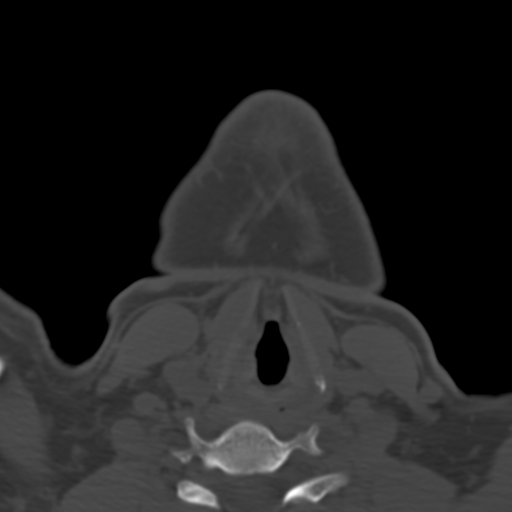
[im 20/94  bone]
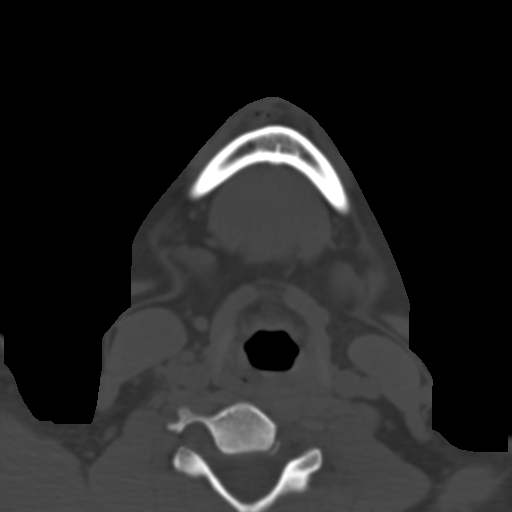
[im 34/94  bone]
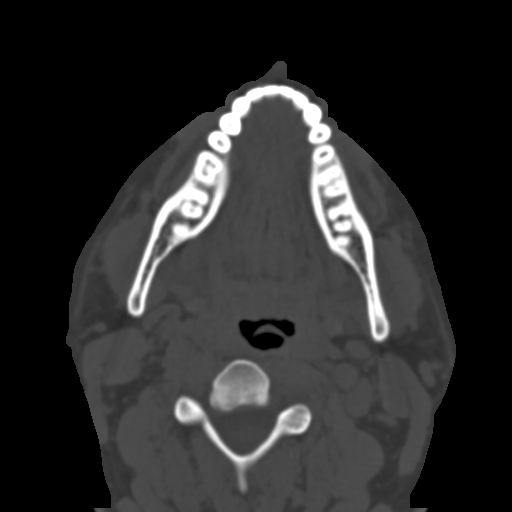
[im 40/94  brain]
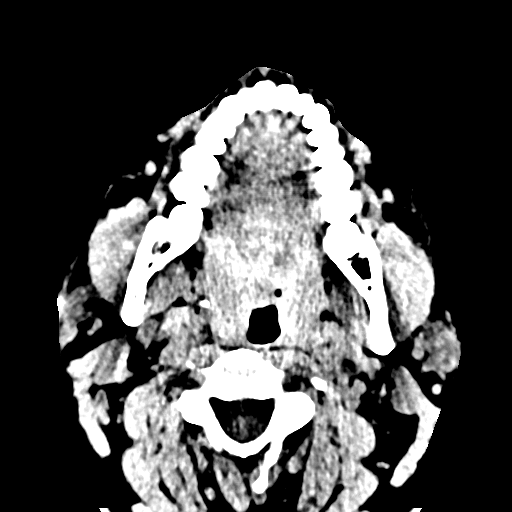
[im 40/94  bone]
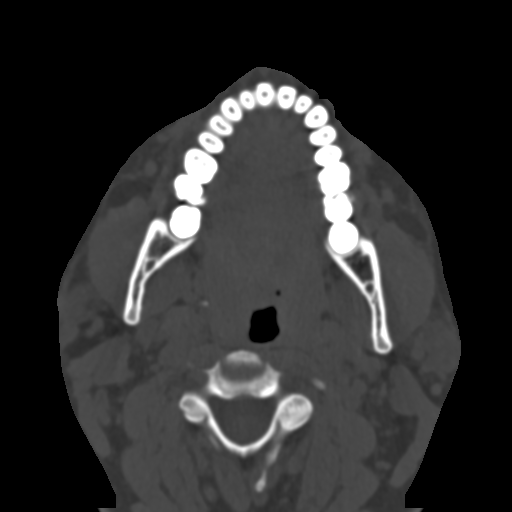
[im 47/94  bone]
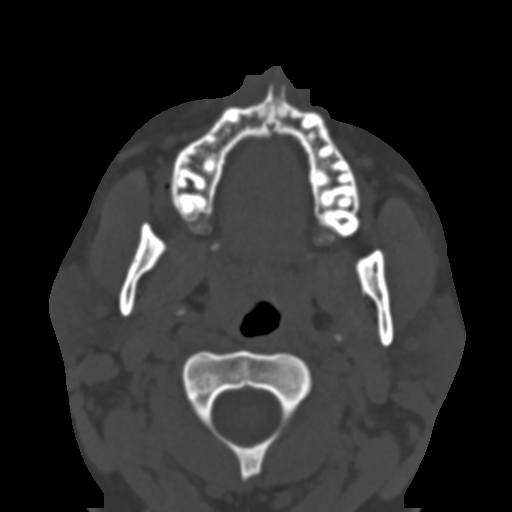
[im 54/94  bone]
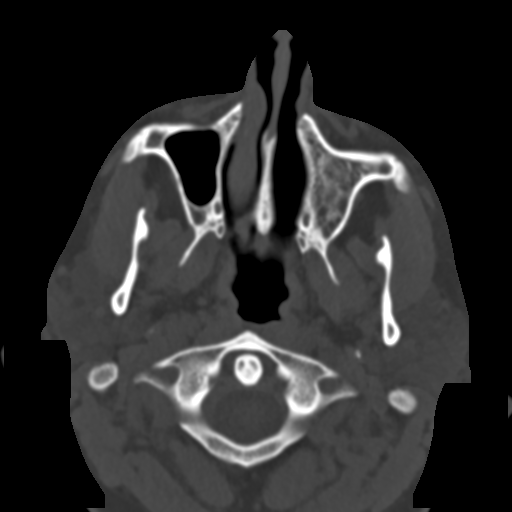
[im 60/94  bone]
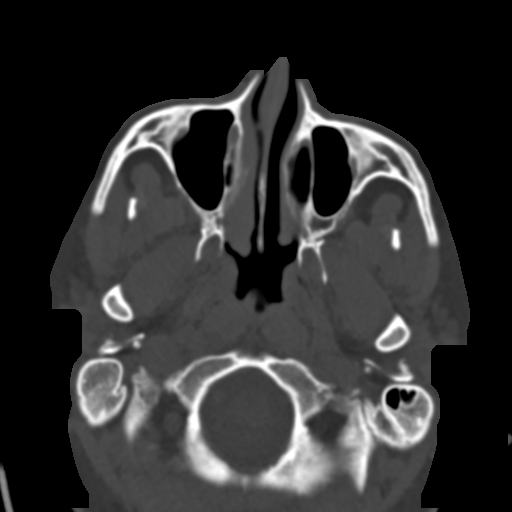
[im 74/94  brain]
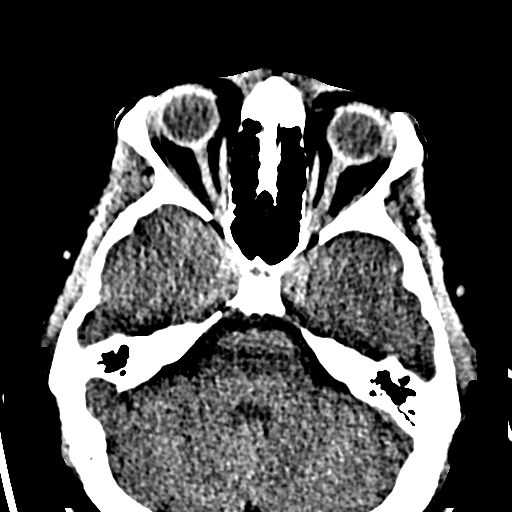
[im 74/94  bone]
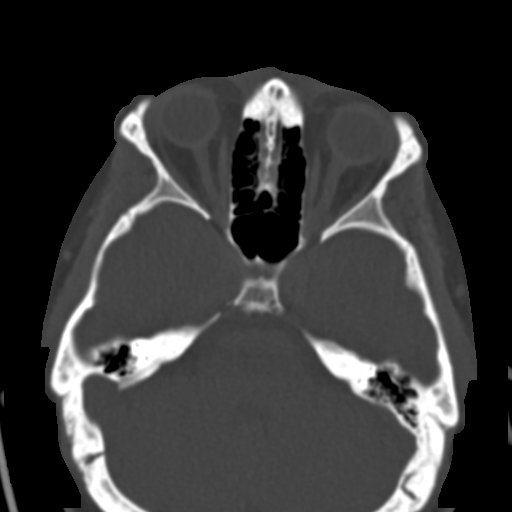
[im 80/94  bone]
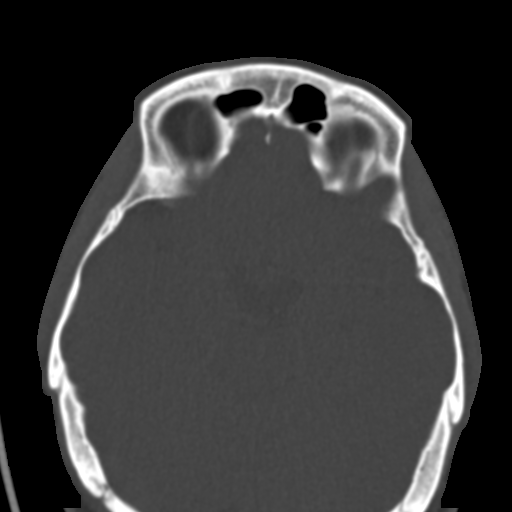
[im 87/94  bone]
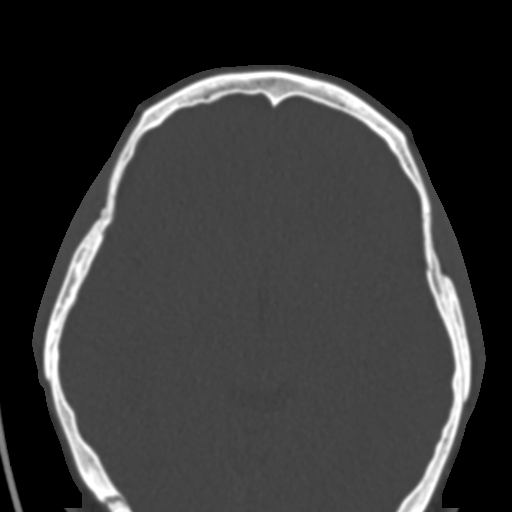

[Series 7: st cor · coronal · 0.34mm/px · 3 of 91 slices shown]
[im 23/91  bone]
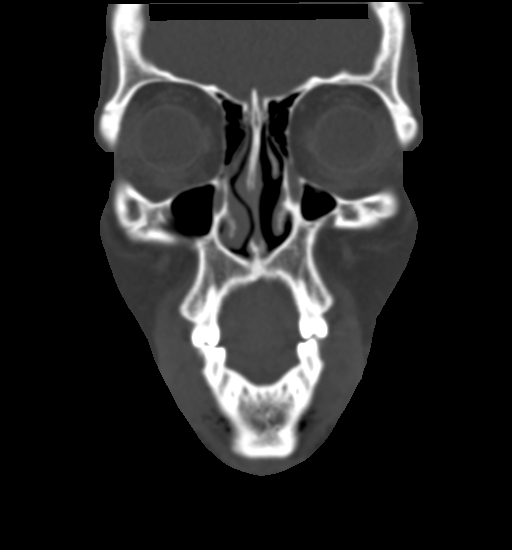
[im 46/91  bone]
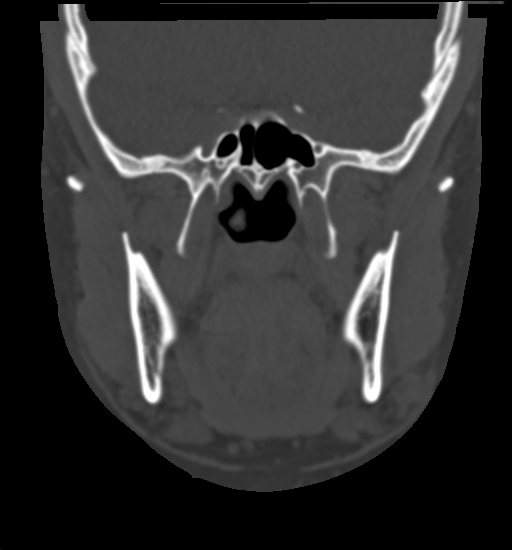
[im 68/91  bone]
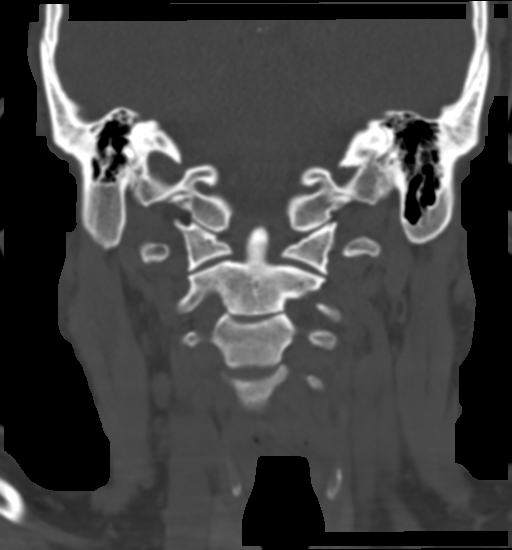

[Series 8: st sag · sagittal · 0.35mm/px · 2 of 99 slices shown]
[im 33/99  bone]
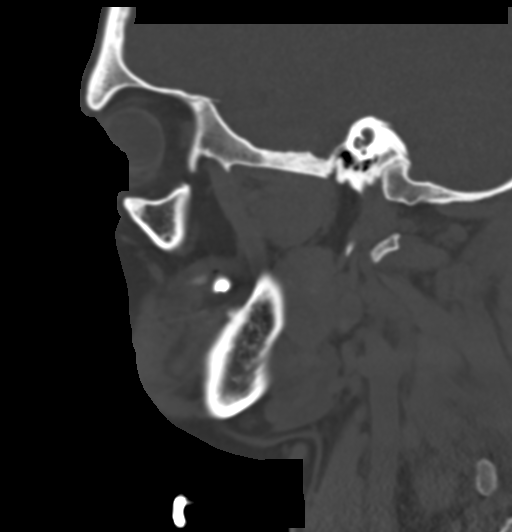
[im 66/99  bone]
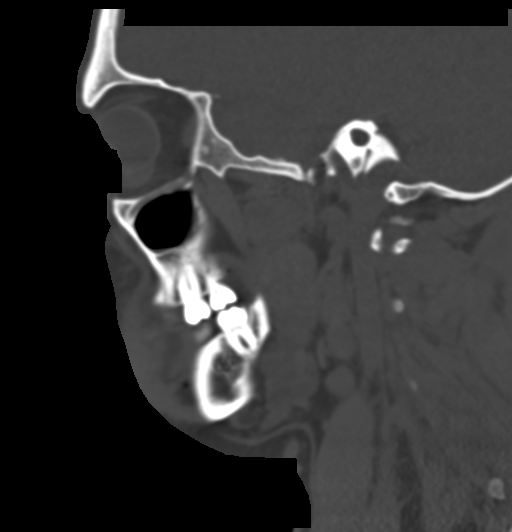

[16 of 47 positions shown; findings below may reference images not displayed]

FINDINGS: Osseous: There is a nondisplaced incomplete fracture seen through
the right inferior mandible between the lateral incisor and canine.
Small foci of subcutaneous emphysema seen overlying this region. No
other fractures are identified.

Orbits: No fracture identified. Unremarkable appearance of globes
and orbits.

Sinuses: The visualized paranasal sinuses and mastoid air cells are
unremarkable.

Soft tissues: Soft tissue swelling with a small hematoma and
subcutaneous emphysema seen overlying the lower mandible.

Limited intracranial: No acute findings.
IMPRESSION: Nondisplaced incomplete fracture seen through the right inferior
mandible between the lateral incisor and canine.

There is small soft tissue hematoma with swelling and subcutaneous
emphysema overlying the lower mandible.

## 2021-11-30 ENCOUNTER — Ambulatory Visit: Payer: 59 | Admitting: Plastic Surgery

## 2021-12-27 ENCOUNTER — Ambulatory Visit: Payer: 59 | Admitting: Plastic Surgery

## 2021-12-28 ENCOUNTER — Ambulatory Visit (INDEPENDENT_AMBULATORY_CARE_PROVIDER_SITE_OTHER): Payer: Worker's Compensation | Admitting: Plastic Surgery

## 2021-12-28 ENCOUNTER — Other Ambulatory Visit: Payer: Self-pay

## 2021-12-28 DIAGNOSIS — S01511D Laceration without foreign body of lip, subsequent encounter: Secondary | ICD-10-CM | POA: Diagnosis not present

## 2021-12-28 NOTE — Progress Notes (Signed)
Patient presents postop from scar revision of his upper lip.  He feels like things have improved certainly since the revision.  He feels that his vermilion border is lined up better but still notes some irregularities in the contour of the dry vermilion and some groove still present on the skin portion of the lip.  On exam his vermilion border looks like it is lined up great.  Along the red lip dry vermilion there is some irregular contour that is a few millimeters in size that feels firm.  The skin portion of the lip has improved with the dermabrasion but there are still some scars visible.  Overall I think he is coming along well.  I recommended giving another few months and some massage for the red lip and I expect a lot of this will smooth out.  If he does continue to have some contour problems I can do a small revision and potentially some more dermabrasion of the skin but we will see how he feels a few months from now.  All of his questions were answered.

## 2022-03-14 ENCOUNTER — Other Ambulatory Visit: Payer: Self-pay

## 2022-03-14 ENCOUNTER — Ambulatory Visit (INDEPENDENT_AMBULATORY_CARE_PROVIDER_SITE_OTHER): Payer: Worker's Compensation | Admitting: Plastic Surgery

## 2022-03-14 ENCOUNTER — Encounter: Payer: Self-pay | Admitting: Plastic Surgery

## 2022-03-14 DIAGNOSIS — S01511D Laceration without foreign body of lip, subsequent encounter: Secondary | ICD-10-CM

## 2022-03-14 NOTE — Progress Notes (Signed)
? ?  Referring Provider ?Omelia Blackwater, MD ?No address on file  ? ?CC:  ?Chief Complaint  ?Patient presents with  ? Follow-up  ?   ? ?Jeffrey Gibbs is an 21 y.o. male.  ?HPI: Patient presents about 5 months out from scar revision of his upper lip.  He initially had a step-off at the vermilion border which was nicely corrected.  We have been watching some scarring on his skin portion of his lip in addition to the dry vermilion.  He feels like things are moving in the right direction in terms of the dry vermilion but still notices some contour irregularities across his philtral columns on the skin portion of the lip.  He wants to see if anything further could be done. ? ?Review of Systems ?General: Denies fevers and chills ? ?Physical Exam ? ?  09/27/2021  ?  3:40 PM 09/10/2021  ?  3:05 PM 02/15/2021  ?  8:40 AM  ?Vitals with BMI  ?Systolic 121 122 440  ?Diastolic 81 84 75  ?Pulse 66 64 64  ?  ?General:  No acute distress,  Alert and oriented, Non-Toxic, Normal speech and affect ?Overall examination shows improvement in the scarring in the upper lip.  There is a small amount of nodularity along the vertical component of the scar of the red vermilion but this has improved quite a bit since last time.  He does have a scar that extends in a curvilinear fashion along and perpendicular to the philtral columns on the skin and while this is a fine line it still can generate a contour shift depending on the lighting. ? ?Assessment/Plan ?Patient presents with an upper lip scar status post revision.  He is overall doing well and has noted quite a bit of improvement.  Today we discussed an additional round of dermabrasion to the skin portion of the upper lip which I think would offer some improvement in that area.  Regarding the excess vermilion we will plan to wait a bit longer on that as it seems like its smoothing out on its own.  If it does not smooth out all the way we could always do a small revision of the excess  there if necessary.  He is in agreement with the plan and interested in moving forward. ? ?Allena Napoleon ?03/14/2022, 5:19 PM  ? ? ?    ?

## 2022-04-11 ENCOUNTER — Ambulatory Visit: Payer: Self-pay | Admitting: Plastic Surgery

## 2022-04-11 DIAGNOSIS — S01511D Laceration without foreign body of lip, subsequent encounter: Secondary | ICD-10-CM

## 2022-04-11 DIAGNOSIS — Z719 Counseling, unspecified: Secondary | ICD-10-CM

## 2022-04-11 NOTE — Progress Notes (Signed)
Patient presents in follow-up for his upper lip scar.  We had previously discussed doing dermabrasion to the skin portion of his upper lip to help blend the scar.  We reviewed the risks and benefits and he is interested in moving forward.  The upper lip was anesthetized using topical local anesthetic.  This was given and 30 minutes to work.  The derma Brader was then used to dermabrasion around the scar and the surrounding skin down to punctate dermal bleeding.  Hemostasis was obtained with pressure.  Vaseline was applied.  We will plan to follow-up with the patient at his next visit.  All of his questions were answered. ?

## 2022-04-18 ENCOUNTER — Ambulatory Visit: Payer: Worker's Compensation | Admitting: Plastic Surgery

## 2022-05-04 ENCOUNTER — Telehealth: Payer: Self-pay | Admitting: Plastic Surgery

## 2022-05-04 NOTE — Telephone Encounter (Signed)
Patient called to follow up on bill; advised to contact this office by our billing department. States all charges should go through [employer]. Still has $10 outstanding balance and doesn't know what it's for. He's been told by billing the charge can't be transferred to worker's comp since it's not related. Patient wants to know what it's for; DOS 04/11/22 was [phone]. ?

## 2022-05-09 ENCOUNTER — Ambulatory Visit (INDEPENDENT_AMBULATORY_CARE_PROVIDER_SITE_OTHER): Payer: No Typology Code available for payment source | Admitting: Plastic Surgery

## 2022-05-09 ENCOUNTER — Encounter: Payer: Self-pay | Admitting: Plastic Surgery

## 2022-05-09 DIAGNOSIS — S01511D Laceration without foreign body of lip, subsequent encounter: Secondary | ICD-10-CM | POA: Diagnosis not present

## 2022-05-09 NOTE — Progress Notes (Signed)
? ?  Referring Provider ?Dixie Dials, MD ?No address on file  ? ?CC:  ?Chief Complaint  ?Patient presents with  ? Follow-up  ?   ? ?Jeffrey Gibbs is an 21 y.o. male.  ?HPI: Patient presents in follow-up for dermabrasion of his upper lip scar.  He feels like we have made some progress and he is overall happy with how things have turned out.  He feels like things are still improving and is overall satisfied. ? ?Review of Systems ?General: Denies fevers and chills ? ?Physical Exam ? ?  09/27/2021  ?  3:40 PM 09/10/2021  ?  3:05 PM 02/15/2021  ?  8:40 AM  ?Vitals with BMI  ?Systolic [id number]  ?Diastolic 81 84 75  ?Pulse 66 64 64  ?  ?General:  No acute distress,  Alert and oriented, Non-Toxic, Normal speech and affect ?On examination he still has a bit of skin bunching along the red lip component of the scar revision that I did to realign his vermilion border.  This is improving every time I see him however.  Regarding the skin upper lip scar this is improved quite a bit with dermabrasion and continues to get better. ? ?Assessment/Plan ?Patient overall has a reasonably good result for scar revision after his lip trauma.  He is overall happy and is planning to move to [place].  At this point I think he is done well and we will plan to see him again on an as-needed basis.  All of his questions were answered. ? ?Cindra Presume ?05/09/2022, 3:13 PM  ? ? ?    ?

## 2022-10-23 ENCOUNTER — Telehealth: Payer: Self-pay

## 2022-10-23 NOTE — Telephone Encounter (Signed)
Received a form from [place] addressed to [place]. Called Tiffany and advised that Dr.Pace is no longer a provider at our office and that it would be best for them to contact the patient to have him swing by to fill out an ROI as we wouldn't be able to complete the form. Tiffany verbalized understand and said she will reach out to the patient.
